# Patient Record
Sex: Male | Born: 1990 | Race: White | Hispanic: No | Marital: Single | Smoking: Never smoker
Health system: Southern US, Community
[De-identification: ages and names within clinical notes are randomized; demographics above are authoritative.]

---
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
## 2014-01-03 ENCOUNTER — Emergency Department (HOSPITAL_COMMUNITY)
Admission: EM | Admit: 2014-01-03 | Discharge: 2014-01-03 | Disposition: A | Discharge status: Home / Self Care | Payer: BC Managed Care – PPO | Attending: Emergency Medicine | Admitting: Emergency Medicine

## 2014-01-03 ENCOUNTER — Encounter (HOSPITAL_COMMUNITY): Payer: Self-pay | Admitting: Emergency Medicine

## 2014-01-03 DIAGNOSIS — L03116 Cellulitis of left lower limb: Secondary | ICD-10-CM | POA: Diagnosis not present

## 2014-01-03 DIAGNOSIS — M79605 Pain in left leg: Secondary | ICD-10-CM | POA: Diagnosis present

## 2014-01-03 MED ORDER — CEFTRIAXONE SODIUM 1 G IJ SOLR
1.0000 g | Freq: Once | INTRAMUSCULAR | Status: AC
  Administered 2014-01-03: 1 g via INTRAMUSCULAR
  Filled 2014-01-03: qty 10

## 2014-01-03 MED ORDER — CEPHALEXIN 500 MG PO CAPS
500.0000 mg | ORAL_CAPSULE | Freq: Once | ORAL | Status: AC
  Administered 2014-01-03: 500 mg via ORAL
  Filled 2014-01-03: qty 1

## 2014-01-03 MED ORDER — CEPHALEXIN 500 MG PO CAPS: 500.0000 mg | ORAL_CAPSULE | Freq: Four times a day (QID) | ORAL | Status: AC

## 2014-01-03 NOTE — ED Provider Notes (Signed)
CSN: [id number]     Arrival date & time 01/03/14  2102 History  This chart was scribed for Geoffery Lyonsouglas Lakoda Raske, MD by Bronson CurbJacqueline Melvin, ED Scribe. This patient was seen in room APA19/APA19 and the patient's care was started at 9:32 PM.     Chief Complaint  Patient presents with  . Leg Pain    The history is provided by the patient. No language interpreter was used.     HPI Comments: Bruce Sweeney is a 23 y.o. male who presents to the Emergency Department complaining of left lower leg pain onset today. There is associated redness and swelling to the lower leg and ankle. Patient states he recently had poison ivy to the back of th left leg. He states was prescribed a 10 day course of bactrim and reports taking 1 dose tonight. He denies history of the similar episodes. Patient has no history of significant medical conditions.   History reviewed. No pertinent past medical history. History reviewed. No pertinent past surgical history. History reviewed. No pertinent family history. History  Substance Use Topics  . Smoking status: Never Smoker   . Smokeless tobacco: Not on file  . Alcohol Use: No    Review of Systems  A complete 10 system review of systems was obtained and all systems are negative except as noted in the HPI and PMH.    Allergies  Review of patient's allergies indicates no known allergies.  Home Medications   Prior to Admission medications   Medication Sig Start Date End Date Taking? Authorizing Provider  ibuprofen (ADVIL,MOTRIN) 200 MG tablet Take 200 mg by mouth every 6 (six) hours as needed for mild pain or moderate pain.   Yes Historical Provider, MD  mupirocin ointment (BACTROBAN) 2 % Place 1 application into the nose 4 (four) times daily. Applied to affected area(s) of left leg/or as directed   Yes Historical Provider, MD  NON FORMULARY Take by mouth daily. Patient takes a variety of weight training supplements daily   Yes Historical Provider, MD  Nutritional  Supplements (HIGH-PROTEIN NUTRITIONAL SHAKE PO) Take 1 Can by mouth daily.   Yes Historical Provider, MD  sulfamethoxazole-trimethoprim (BACTRIM DS,SEPTRA DS) 800-160 MG per tablet Take 1 tablet by mouth 2 (two) times daily. 10 day course starting on 01/03/2014   Yes Historical Provider, MD   Triage Vitals: BP 124/85 mmHg  Pulse 84  Temp(Src) 98.4 F (36.9 C) (Oral)  Resp 16  Ht 6\' 1"  (1.854 m)  Wt 280 lb (127.007 kg)  BMI 36.95 kg/m2  SpO2 99%  Physical Exam  Constitutional: He is oriented to person, place, and time. He appears well-developed and well-nourished. No distress.  HENT:  Head: Normocephalic and atraumatic.  Eyes: Conjunctivae and EOM are normal.  Neck: Neck supple. No tracheal deviation present.  Cardiovascular: Normal rate.   Pulmonary/Chest: Effort normal. No respiratory distress.  Musculoskeletal: Normal range of motion. He exhibits tenderness.  The lateral aspect of the left lower leg is noted to be erythematous, warm to the touch, and tender. There is no fluctuance.  Neurological: He is alert and oriented to person, place, and time.  Skin: Skin is warm and dry.  Psychiatric: He has a normal mood and affect. His behavior is normal.  Nursing note and vitals reviewed.   ED Course  Procedures (including critical care time)  DIAGNOSTIC STUDIES: Oxygen Saturation is 99% on room air, normal by my interpretation.    COORDINATION OF CARE: At 2134 Discussed treatment plan with patient which includes  Keflex. Patient agrees.    Labs Review Labs Reviewed - No data to display  Imaging Review No results found.   EKG Interpretation None      MDM   Final diagnoses:  None    This appears to be cellulitis of the lateral aspect of the left leg. He was started on Bactrim earlier today. He will receive ceftriaxone and I will add Keflex. He is to return if his symptoms substantially worsen or do not improve in the next 1-2 days.  I personally performed the services  described in this documentation, which was scribed in my presence. The recorded information has been reviewed and is accurate.      Geoffery Lyonsouglas Mccrae Speciale, MD 01/03/14 2229

## 2014-01-03 NOTE — Discharge Instructions (Signed)
Keflex and Bactrim as prescribed.  Return to the emergency department for high fever, worsening pain, worsening redness, no improvement in the next 24-48 hours, or other new and concerning symptoms.   Cellulitis Cellulitis is an infection of the skin and the tissue beneath it. The infected area is usually red and tender. Cellulitis occurs most often in the arms and lower legs.  CAUSES  Cellulitis is caused by bacteria that enter the skin through cracks or cuts in the skin. The most common types of bacteria that cause cellulitis are staphylococci and streptococci. SIGNS AND SYMPTOMS   Redness and warmth.  Swelling.  Tenderness or pain.  Fever. DIAGNOSIS  Your health care provider can usually determine what is wrong based on a physical exam. Blood tests may also be done. TREATMENT  Treatment usually involves taking an antibiotic medicine. HOME CARE INSTRUCTIONS   Take your antibiotic medicine as directed by your health care provider. Finish the antibiotic even if you start to feel better.  Keep the infected arm or leg elevated to reduce swelling.  Apply a warm cloth to the affected area up to 4 times per day to relieve pain.  Take medicines only as directed by your health care provider.  Keep all follow-up visits as directed by your health care provider. SEEK MEDICAL CARE IF:   You notice red streaks coming from the infected area.  Your red area gets larger or turns dark in color.  Your bone or joint underneath the infected area becomes painful after the skin has healed.  Your infection returns in the same area or another area.  You notice a swollen bump in the infected area.  You develop new symptoms.  You have a fever. SEEK IMMEDIATE MEDICAL CARE IF:   You feel very sleepy.  You develop vomiting or diarrhea.  You have a general ill feeling (malaise) with muscle aches and pains. MAKE SURE YOU:   Understand these instructions.  Will watch your  condition.  Will get help right away if you are not doing well or get worse. Document Released: 10/30/2004 Document Revised: 06/06/2013 Document Reviewed: 04/07/2011 [facility] Patient Information 2015 [place], [place]. This information is not intended to replace advice given to you by your health care provider. Make sure you discuss any questions you have with your health care provider.

## 2014-01-03 NOTE — ED Notes (Signed)
Patient complains of pain, redness, and swelling to left lower leg. Reports recently had poison ivy to back of leg. Patient was prescribed bactrim and a cream to apply to leg today.

## 2014-06-28 ENCOUNTER — Other Ambulatory Visit: Payer: Self-pay | Admitting: Occupational Medicine

## 2014-06-28 ENCOUNTER — Ambulatory Visit
Admission: RE | Admit: 2014-06-28 | Discharge: 2014-06-28 | Disposition: A | Discharge status: Home / Self Care | Payer: No Typology Code available for payment source | Source: Ambulatory Visit | Attending: Occupational Medicine | Admitting: Occupational Medicine

## 2014-06-28 DIAGNOSIS — Z021 Encounter for pre-employment examination: Secondary | ICD-10-CM

## 2021-01-10 ENCOUNTER — Other Ambulatory Visit: Payer: Self-pay

## 2021-01-10 ENCOUNTER — Emergency Department (HOSPITAL_BASED_OUTPATIENT_CLINIC_OR_DEPARTMENT_OTHER)
Admission: EM | Admit: 2021-01-10 | Discharge: 2021-01-10 | Disposition: A | Discharge status: Home / Self Care | Payer: Self-pay | Attending: Emergency Medicine | Admitting: Emergency Medicine

## 2021-01-10 DIAGNOSIS — Z20822 Contact with and (suspected) exposure to covid-19: Secondary | ICD-10-CM | POA: Insufficient documentation

## 2021-01-10 DIAGNOSIS — J069 Acute upper respiratory infection, unspecified: Secondary | ICD-10-CM | POA: Insufficient documentation

## 2021-01-10 LAB — RESP PANEL BY RT-PCR (FLU A&B, COVID) ARPGX2
Influenza A by PCR: NEGATIVE
Influenza B by PCR: NEGATIVE
SARS Coronavirus 2 by RT PCR: NEGATIVE

## 2021-01-10 MED ORDER — BENZONATATE 100 MG PO CAPS: 100.0000 mg | ORAL_CAPSULE | Freq: Three times a day (TID) | ORAL | 0 refills | Status: AC

## 2021-01-10 NOTE — ED Notes (Signed)
Pt d/c home per MD order. Discharge summary reviewed with pt, pt verbalizes understanding. Ambulatory , No s/s of acute distress noted at discharge.

## 2021-01-10 NOTE — Discharge Instructions (Signed)
Expect URI symptoms to last for 14-21 days. Dry cough can last up to 4 weeks. Covid/flu test will result in 24 hours, check on my chart. You should receive call if result is positive. To help yourself recover, drink plenty of fluids and get plenty of rest.   For fever, HA, sore throat or body aches - take tylenol (500-100 mg) every 8 hours. Do not exceed 3000mg /day. You ca also take ibuprofen.   For cough, take tessalon perles every 8 hours as needed. You can also use cough drops or drink honey.

## 2021-01-10 NOTE — ED Provider Notes (Signed)
[facility] EMERGENCY DEPT Provider Note   CSN: [id number] Arrival date & time: 01/10/21  2024     History Chief Complaint  Patient presents with   flu sx    Bruce Sweeney is a 30 y.o. male.  HPI  Patient diagnosed with recent ear infection presents due to viral symptoms.  He has been having cough, body aches, chills, sore throat, nasal congestion x4 days.  Symptoms started acutely on Monday, they have an intermittent.  He was seen at urgent care who declined testing him for flu because "it was too late in the season".  He was tested for strep which was negative and they diagnosed him with an ear infection and started him on amoxicillin.  Patient denies any ear pain, he is still feeling sick and having body aches.  He has tried Tylenol, he has also tried Phenergan for nausea.  He denies any frank vomiting but is having a posttussive emesis.  Endorses feeling nauseated at night when he lays down flat.  Not having any chest pain or shortness of breath.  No past medical history on file.  There are no problems to display for this patient.   No past surgical history on file.     No family history on file.  Social History   Tobacco Use   Smoking status: Never  Substance Use Topics   Alcohol use: No   Drug use: No    Home Medications Prior to Admission medications   Medication Sig Start Date End Date Taking? Authorizing Provider  benzonatate (TESSALON) 100 MG capsule Take 1 capsule (100 mg total) by mouth every 8 (eight) hours. 01/10/21  Yes Theron Arista, PA-C  cephALEXin (KEFLEX) 500 MG capsule Take 1 capsule (500 mg total) by mouth 4 (four) times daily. 01/03/14   Geoffery Lyons, MD  ibuprofen (ADVIL,MOTRIN) 200 MG tablet Take 200 mg by mouth every 6 (six) hours as needed for mild pain or moderate pain.    [provider]  mupirocin ointment (BACTROBAN) 2 % Place 1 application into the nose 4 (four) times daily. Applied to affected area(s) of left leg/or  as directed    [provider]  NON FORMULARY Take by mouth daily. Patient takes a variety of weight training supplements daily    [provider]  Nutritional Supplements (HIGH-PROTEIN NUTRITIONAL SHAKE PO) Take 1 Can by mouth daily.    [provider]  sulfamethoxazole-trimethoprim (BACTRIM DS,SEPTRA DS) 800-160 MG per tablet Take 1 tablet by mouth 2 (two) times daily. 10 day course starting on 01/03/2014    [provider]    Allergies    Patient has no known allergies.  Review of Systems   Review of Systems  Constitutional:  Positive for fever.  HENT:  Positive for congestion and sore throat.   Respiratory:  Negative for shortness of breath.   Cardiovascular:  Negative for chest pain.  Musculoskeletal:  Positive for myalgias.  Neurological:  Positive for headaches.   Physical Exam Updated Vital Signs BP 118/69 (BP Location: Left Arm)   Pulse 69   Temp 99.8 F (37.7 C) (Oral)   Resp 18   Ht 6\' 1"  (1.854 m)   Wt 113.4 kg   SpO2 97%   BMI 32.98 kg/m   Physical Exam Vitals and nursing note reviewed. Exam conducted with a chaperone present.  Constitutional:      Appearance: Normal appearance.  HENT:     Head: Normocephalic.     Nose: Congestion present.  Mouth/Throat:     Pharynx: Posterior oropharyngeal erythema present.  Eyes:     Extraocular Movements: Extraocular movements intact.     Pupils: Pupils are equal, round, and reactive to light.  Cardiovascular:     Rate and Rhythm: Normal rate and regular rhythm.  Pulmonary:     Effort: Pulmonary effort is normal.     Breath sounds: Normal breath sounds.     Comments: Lungs CTA bilaterally. No accessory muscle use. Speaking in complete sentences.  Abdominal:     General: Abdomen is flat.     Palpations: Abdomen is soft.  Musculoskeletal:     Cervical back: Normal range of motion.  Neurological:     Mental Status: He is alert.  Psychiatric:        Mood and Affect: Mood  normal.    ED Results / Procedures / Treatments   Labs (all labs ordered are listed, but only abnormal results are displayed) Labs Reviewed  RESP PANEL BY RT-PCR (FLU A&B, COVID) [id number]    EKG None  Radiology No results found.  Procedures Procedures   Medications Ordered in ED Medications - No data to display  ED Course  I have reviewed the triage vital signs and the nursing notes.  Pertinent labs & imaging results that were available during my care of the patient were reviewed by me and considered in my medical decision making (see chart for details).    MDM Rules/Calculators/A&P                           PE and history suspicious for URI. Covid/flu test pending.   No signs of respiratory distress. No hypoxia or tachycardia. Lungs CTA bilaterally. Doubt underlying cardiopulmonary process.  I considered, but think unlikely, dangerous causes of this patient's symptoms to include ACS, CHF or COPD exacerbations, pneumonia, pneumothorax.  Patient is nontoxic appearing and not in need of emergent medical intervention. Patient told to self isolate at home until symptoms subside for 72 hours, and that they will call with the COVID results.   Final Clinical Impression(s) / ED Diagnoses Final diagnoses:  Viral URI    Rx / DC Orders ED Discharge Orders          Ordered    benzonatate (TESSALON) 100 MG capsule  Every 8 hours        01/10/21 2114             Theron Arista, PA-C 01/10/21 2116    Tegeler, Canary Brim, MD 01/10/21 [phone]

## 2021-01-10 NOTE — ED Triage Notes (Signed)
Pt to ED c/o flu like symptoms over past 2 weeks. 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 Dx with ear infection on Monday at urgent care, taking amoxicillin as prescribed, did not test for flu at urgent care. C/O sore throat, chills, HA currently. Not taking OTC medications.
